# Patient Record
Sex: Male | Born: 1945 | Race: Black or African American | Hispanic: No | Marital: Married | Smoking: Former smoker
Health system: Southern US, Community
[De-identification: ages and names within clinical notes are randomized; demographics above are authoritative.]

## PROBLEM LIST (undated history)

## (undated) DIAGNOSIS — N529 Male erectile dysfunction, unspecified: Secondary | ICD-10-CM

## (undated) DIAGNOSIS — I739 Peripheral vascular disease, unspecified: Secondary | ICD-10-CM

## (undated) DIAGNOSIS — I10 Essential (primary) hypertension: Secondary | ICD-10-CM

---

## 2015-07-24 ENCOUNTER — Ambulatory Visit: Payer: Self-pay | Admitting: Family Medicine

## 2017-02-22 ENCOUNTER — Ambulatory Visit (INDEPENDENT_AMBULATORY_CARE_PROVIDER_SITE_OTHER): Payer: Medicare Other

## 2017-02-22 ENCOUNTER — Encounter: Payer: Self-pay | Admitting: Gynecology

## 2017-02-22 ENCOUNTER — Emergency Department: Payer: Medicare Other

## 2017-02-22 ENCOUNTER — Ambulatory Visit
Admission: EM | Admit: 2017-02-22 | Discharge: 2017-02-22 | Disposition: A | Discharge status: Home / Self Care | Payer: Medicare Other | Attending: Family Medicine | Admitting: Family Medicine

## 2017-02-22 ENCOUNTER — Emergency Department
Admission: EM | Admit: 2017-02-22 | Discharge: 2017-02-22 | Disposition: A | Discharge status: Home / Self Care | Payer: Medicare Other | Attending: Emergency Medicine | Admitting: Emergency Medicine

## 2017-02-22 DIAGNOSIS — K5900 Constipation, unspecified: Secondary | ICD-10-CM | POA: Diagnosis not present

## 2017-02-22 DIAGNOSIS — K2970 Gastritis, unspecified, without bleeding: Secondary | ICD-10-CM | POA: Insufficient documentation

## 2017-02-22 DIAGNOSIS — Z7982 Long term (current) use of aspirin: Secondary | ICD-10-CM | POA: Insufficient documentation

## 2017-02-22 DIAGNOSIS — Z79899 Other long term (current) drug therapy: Secondary | ICD-10-CM | POA: Diagnosis not present

## 2017-02-22 DIAGNOSIS — Z87891 Personal history of nicotine dependence: Secondary | ICD-10-CM | POA: Diagnosis not present

## 2017-02-22 DIAGNOSIS — I10 Essential (primary) hypertension: Secondary | ICD-10-CM | POA: Insufficient documentation

## 2017-02-22 DIAGNOSIS — R1111 Vomiting without nausea: Secondary | ICD-10-CM

## 2017-02-22 DIAGNOSIS — R1084 Generalized abdominal pain: Secondary | ICD-10-CM | POA: Diagnosis not present

## 2017-02-22 DIAGNOSIS — R1013 Epigastric pain: Secondary | ICD-10-CM | POA: Diagnosis present

## 2017-02-22 DIAGNOSIS — I739 Peripheral vascular disease, unspecified: Secondary | ICD-10-CM | POA: Diagnosis not present

## 2017-02-22 HISTORY — DX: Essential (primary) hypertension: I10

## 2017-02-22 HISTORY — DX: Peripheral vascular disease, unspecified: I73.9

## 2017-02-22 HISTORY — DX: Male erectile dysfunction, unspecified: N52.9

## 2017-02-22 LAB — URINALYSIS, COMPLETE (UACMP) WITH MICROSCOPIC
BILIRUBIN URINE: NEGATIVE
Bacteria, UA: NONE SEEN
Glucose, UA: NEGATIVE mg/dL
Ketones, ur: NEGATIVE mg/dL
Leukocytes, UA: NEGATIVE
Nitrite: NEGATIVE
Protein, ur: NEGATIVE mg/dL
SPECIFIC GRAVITY, URINE: 1.02 (ref 1.005–1.030)
pH: 5 (ref 5.0–8.0)

## 2017-02-22 LAB — COMPREHENSIVE METABOLIC PANEL
ALT: 21 U/L (ref 17–63)
AST: 34 U/L (ref 15–41)
Albumin: 4.5 g/dL (ref 3.5–5.0)
Alkaline Phosphatase: 49 U/L (ref 38–126)
Anion gap: 10 (ref 5–15)
BILIRUBIN TOTAL: 1 mg/dL (ref 0.3–1.2)
BUN: 15 mg/dL (ref 6–20)
CALCIUM: 9.1 mg/dL (ref 8.9–10.3)
CO2: 29 mmol/L (ref 22–32)
CREATININE: 1.47 mg/dL — AB (ref 0.61–1.24)
Chloride: 93 mmol/L — ABNORMAL LOW (ref 101–111)
GFR calc Af Amer: 54 mL/min — ABNORMAL LOW (ref >60)
GFR, EST NON AFRICAN AMERICAN: 46 mL/min — AB (ref >60)
Glucose, Bld: 119 mg/dL — ABNORMAL HIGH (ref 65–99)
Potassium: 3.1 mmol/L — ABNORMAL LOW (ref 3.5–5.1)
Sodium: 132 mmol/L — ABNORMAL LOW (ref 135–145)
TOTAL PROTEIN: 8 g/dL (ref 6.5–8.1)

## 2017-02-22 LAB — CBC WITH DIFFERENTIAL/PLATELET
BASOS ABS: 0.1 10*3/uL (ref 0–0.1)
Basophils Relative: 1 %
Eosinophils Absolute: 0.1 10*3/uL (ref 0–0.7)
Eosinophils Relative: 1 %
HEMATOCRIT: 44.8 % (ref 40.0–52.0)
Hemoglobin: 14.2 g/dL (ref 13.0–18.0)
LYMPHS ABS: 3.9 10*3/uL — AB (ref 1.0–3.6)
LYMPHS PCT: 38 %
MCH: 24.6 pg — AB (ref 26.0–34.0)
MCHC: 31.8 g/dL — ABNORMAL LOW (ref 32.0–36.0)
MCV: 77.6 fL — AB (ref 80.0–100.0)
MONO ABS: 0.9 10*3/uL (ref 0.2–1.0)
Monocytes Relative: 8 %
NEUTROS ABS: 5.5 10*3/uL (ref 1.4–6.5)
Neutrophils Relative %: 52 %
Platelets: 285 10*3/uL (ref 150–440)
RBC: 5.77 MIL/uL (ref 4.40–5.90)
RDW: 13.6 % (ref 11.5–14.5)
WBC: 10.4 10*3/uL (ref 3.8–10.6)

## 2017-02-22 LAB — LIPASE, BLOOD: LIPASE: 35 U/L (ref 11–51)

## 2017-02-22 MED ORDER — IOPAMIDOL (ISOVUE-300) INJECTION 61%
30.0000 mL | Freq: Once | INTRAVENOUS | Status: AC | PRN
  Administered 2017-02-22: 30 mL via ORAL

## 2017-02-22 MED ORDER — SODIUM CHLORIDE 0.9 % IV BOLUS (SEPSIS)
1000.0000 mL | Freq: Once | INTRAVENOUS | Status: AC
  Administered 2017-02-22: 1000 mL via INTRAVENOUS

## 2017-02-22 MED ORDER — IOPAMIDOL (ISOVUE-370) INJECTION 76%: 75.0000 mL | Freq: Once | INTRAVENOUS | Status: DC | PRN

## 2017-02-22 MED ORDER — ONDANSETRON HCL 4 MG PO TABS: 4.0000 mg | ORAL_TABLET | Freq: Three times a day (TID) | ORAL | 0 refills | Status: AC | PRN

## 2017-02-22 MED ORDER — PANTOPRAZOLE SODIUM 20 MG PO TBEC: 20.0000 mg | DELAYED_RELEASE_TABLET | Freq: Every day | ORAL | 1 refills | Status: AC

## 2017-02-22 MED ORDER — POTASSIUM CHLORIDE 20 MEQ PO PACK: 20.0000 meq | PACK | Freq: Every day | ORAL | 0 refills | Status: AC

## 2017-02-22 MED ORDER — DICYCLOMINE HCL 20 MG PO TABS: 20.0000 mg | ORAL_TABLET | Freq: Three times a day (TID) | ORAL | 0 refills | Status: AC | PRN

## 2017-02-22 MED ORDER — IOPAMIDOL (ISOVUE-300) INJECTION 61%
80.0000 mL | Freq: Once | INTRAVENOUS | Status: AC | PRN
  Administered 2017-02-22: 80 mL via INTRAVENOUS

## 2017-02-22 MED ORDER — POTASSIUM CHLORIDE CRYS ER 20 MEQ PO TBCR
20.0000 meq | EXTENDED_RELEASE_TABLET | Freq: Once | ORAL | Status: AC
  Administered 2017-02-22: 20 meq via ORAL
  Filled 2017-02-22: qty 1

## 2017-02-22 NOTE — ED Provider Notes (Signed)
Time Seen: Approximately 1544  I have reviewed the triage notes  Chief Complaint: Abdominal Pain   History of Present Illness: Derek Armstrong is a 71 y.o. male who presents from urgent care for evaluation of some epigastric abdominal pain that started on Wednesday night. He states occasional worsening with movement. He had some nausea with episode of vomiting once tonight. He denies any persistent vomiting though states he has had what sounds like reflux and has had a decreased appetite. Chills or productive cough. He denies any melena or hematochezia. He was referred here from an urgent care center for further evaluation of his abdominal pain.   Past Medical History:  Diagnosis Date  . ED (erectile dysfunction)   . Hypertension   . Peripheral vascular disease (HCC)     There are no active problems to display for this patient.   History reviewed. No pertinent surgical history.  History reviewed. No pertinent surgical history.  Current Outpatient Rx  . Order #: [id number] Class: Historical Med  . Order #: [id number] Class: Historical Med  . Order #: [id number] Class: Historical Med  . Order #: [id number] Class: Print  . Order #: [id number] Class: Print  . Order #: [id number] Class: Print  . Order #: [id number] Class: Print    Allergies:  Penicillins  Family History: No family history on file.  Social History: Social History  Substance Use Topics  . Smoking status: Former Games developermoker  . Smokeless tobacco: Never Used     Comment: quit x 3 trs  . Alcohol use Yes     Review of Systems:   10 point review of systems was performed and was otherwise negative:  Constitutional: No fever Eyes: No visual disturbances ENT: No sore throat, ear pain Cardiac: No chest pain Respiratory: No shortness of breath, wheezing, or stridor Abdomen: Epigastric to the bilateral upper quadrant abdominal pain in, no vomiting, No diarrhea Endocrine: No weight loss, No night sweats Extremities: No  peripheral edema, cyanosis Skin: No rashes, easy bruising Neurologic: No focal weakness, trouble with speech or swollowing Urologic: No dysuria, Hematuria, or urinary frequency Patient denies any leg pain, peripheral edema, claudication type symptoms.  Physical Exam:  ED Triage Vitals [02/22/17 1319]  Enc Vitals Group     BP (!) 173/81     Pulse Rate 97     Resp 18     Temp 98 F (36.7 C)     Temp Source Oral     SpO2 97 %     Weight 175 lb (79.4 kg)     Height 5\' 9"  (1.753 m)     Head Circumference      Peak Flow      Pain Score 8     Pain Loc      Pain Edu?      Excl. in GC?     General: Awake , Alert , and Oriented times 3; GCS 15 Head: Normal cephalic , atraumatic Eyes: Pupils equal , round, reactive to light Nose/Throat: No nasal drainage, patent upper airway without erythema or exudate.  Neck: Supple, Full range of motion, No anterior adenopathy or palpable thyroid masses Lungs: Clear to ascultation without wheezes , rhonchi, or rales Heart: Regular rate, regular rhythm without murmurs , gallops , or rubs Abdomen: Mild tenderness over the epigastric area without any rebound, guarding, or rigidity. No reproducible lower abdominal pain or palpable masses are noted      Extremities: 2 plus symmetric pulses. No edema, clubbing or cyanosis Neurologic: normal ambulation, Motor symmetric without deficits,  sensory intact Skin: warm, dry, no rashes   Labs:   All laboratory work was reviewed including any pertinent negatives or positives listed below:  Labs Reviewed  URINALYSIS, COMPLETE (UACMP) WITH MICROSCOPIC - Abnormal; Notable for the following:       Result Value   Color, Urine YELLOW (*)    APPearance CLEAR (*)    Hgb urine dipstick SMALL (*)    Squamous Epithelial / LPF 0-5 (*)    All other components within normal limits    Radiology:  "Ct Abdomen Pelvis W Contrast  Result Date: 02/22/2017 CLINICAL DATA:  71 year old male with abdominal pain and vomiting  for 3 days. EXAM: CT ABDOMEN AND PELVIS WITH CONTRAST TECHNIQUE: Multidetector CT imaging of the abdomen and pelvis was performed using the standard protocol following bolus administration of intravenous contrast. CONTRAST:  80mL ISOVUE-300 IOPAMIDOL (ISOVUE-300) INJECTION 61% COMPARISON:  None. FINDINGS: Lower chest: No acute abnormality. Hepatobiliary: The liver and gallbladder are unremarkable except for tiny hepatic cysts. There is no evidence of biliary dilatation. Pancreas: Unremarkable Spleen: Unremarkable Adrenals/Urinary Tract: Small bilateral renal cysts are present. There is no evidence of renal mass, hydronephrosis or urinary calculi. The adrenal glands and bladder are unremarkable. Stomach/Bowel: Stomach is within normal limits. Appendix appears normal. No evidence of bowel wall thickening, distention, or inflammatory changes. Vascular/Lymphatic: Abdominal aortic atherosclerotic calcifications noted without aneurysm. Atherosclerotic calcification and plaque within the aortic branch vessels and iliac and femoral arteries are also noted bilaterally. There is apparent occlusion of the left external iliac artery with reconstitution of the left common femoral artery. Apparent occlusion of both proximal superficial femoral arteries also noted. Reproductive: Prostate enlargement identified. Other: No ascites, pneumoperitoneum or abscess. Musculoskeletal: No acute or suspicious bony abnormalities. L5-S1 facet arthropathy noted. IMPRESSION: No evidence of acute abnormality. Abdominal aortic atherosclerosis without aneurysm. 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 Apparent occlusion of both proximal superficial femoral arteries also noted. Prostate enlargement. Electronically Signed   By: Harmon Pier M.D.   On: 02/22/2017 17:30   Dg Abd 2 Views  Result Date: 02/22/2017 CLINICAL DATA:   Epigastric pain EXAM: ABDOMEN - 2 VIEW COMPARISON:  None. FINDINGS: Scattered large and small bowel gas is noted. Some fecal material is noted within the colon particularly in the right colon. No free air is seen. No mass lesion is noted. No bony abnormality is seen. IMPRESSION: No acute abnormality noted. Electronically Signed   By: Alcide Clever M.D.   On: 02/22/2017 11:15  "  I personally reviewed the radiologic studies   ED Course:  Patient has the appearance of some peripheral vascular disease, however, I felt this was not the cause of his epigastric pain. I felt this was unlikely to be mesenteric ischemia and most of his vascular pathology seems to be peripheral and found on the CT. Patient has known peripheral vascular disease and he was given a copy of the CAT scan results to take with him to follow up with his primary physician or vascular surgeon as needed. As his abdominal pain is seems to be more of gastritis with decreased appetite and no signs of melena or hematochezia etc. Patient does not appear to have any pancreatitis. Patient was found to have some mild hypokalemia which is likely secondary to his blood pressure medication. He appears to have good control of his blood pressure and I decided to place the patient on some  outpatient supplemental potassium until follow up .     Assessment:  Gastritis Asymptomatic Peripheral vascular disease  Final Clinical Impression:  Final diagnoses:  Gastritis without bleeding, unspecified chronicity, unspecified gastritis type     Plan: * Outpatient " New Prescriptions   DICYCLOMINE (BENTYL) 20 MG TABLET    Take 1 tablet (20 mg total) by mouth 3 (three) times daily as needed for spasms.   ONDANSETRON (ZOFRAN) 4 MG TABLET    Take 1 tablet (4 mg total) by mouth every 8 (eight) hours as needed for nausea or vomiting.   PANTOPRAZOLE (PROTONIX) 20 MG TABLET    Take 1 tablet (20 mg total) by mouth daily.   POTASSIUM CHLORIDE (KLOR-CON) 20 MEQ  PACKET    Take 20 mEq by mouth daily.  " Patient was advised to return immediately if condition worsens. Patient was advised to follow up with their primary care physician or other specialized physicians involved in their outpatient care. The patient and/or family member/power of attorney had laboratory results reviewed at the bedside. All questions and concerns were addressed and appropriate discharge instructions were distributed by the nursing staff. Jennye Moccasin, MD 02/22/17 [phone]

## 2017-02-22 NOTE — ED Triage Notes (Signed)
Per patient stomach issue x 3 days. Patient stated that he is having constipation  Problem and his last bowel movement was 3 days ago. Per patient x 3 day ago vomit x twice after having lab work done. Per patient has not receive his lab result as yet.

## 2017-02-22 NOTE — Discharge Instructions (Signed)
Please drink plenty of fluids and follow up with your primary physician. Return to emergency department for fever, bloody stool, persistent vomiting, leg pain, or any other new concerns  Please return immediately if condition worsens. Please contact her primary physician or the physician you were given for referral. If you have any specialist physicians involved in her treatment and plan please also contact them. Thank you for using [facility].

## 2017-02-22 NOTE — ED Triage Notes (Signed)
Pt sent to ED from urgent care w/ c/o abd pain. Psts pain began Wednesday night, been ongoing since. Pt sts pain worse w/ movement.  Pt reports vomiting Wed night, none since.  Pt alert and oriented. Ambulatory to triage w/o issue. Denies CP, SOB or fevers. Pt sts he had blood work drawn at urgent care w/i last 4 hours.

## 2017-02-22 NOTE — ED Provider Notes (Signed)
[facility]    CSN: [id number] Arrival date & time: 02/22/17  [id number]     History   Chief Complaint Chief Complaint  Patient presents with  . Stomach issue  . Constipation    HPI Derek Armstrong is a 71 y.o. male.   71 yo male with a c/o 3 days of slowly progressively worsening mid abdominal pain. States pain occasionally worse with movement and better if sitting still. Pain is 8/10. Patient also states he's had decreased appetite and has not eaten much food in the last 2-3 days. His last bowel movement was on Wednesday (3 days ago) and also had a couple of episodes of vomiting that day but no vomiting since then.   Of note patient is a former smoker, has hypertension, and moderate to severe peripheral vascular disease ([facility]    The history is provided by the patient.  Constipation    Past Medical History:  Diagnosis Date  . ED (erectile dysfunction)   . Hypertension   . Peripheral vascular disease (HCC)     There are no active problems to display for this patient.   History reviewed. No pertinent surgical history.     Home Medications    Prior to Admission medications   Medication Sig Start Date End Date Taking? Authorizing Provider  aspirin 81 MG chewable tablet Chew by mouth daily.   Yes Historical Provider, MD  cilostazol (PLETAL) 100 MG tablet Take 100 mg by mouth 2 (two) times daily.   Yes Historical Provider, MD  losartan-hydrochlorothiazide (HYZAAR) 100-25 MG tablet Take 1 tablet by mouth daily.   Yes Historical Provider, MD    Family History No family history on file.  Social History Social History  Substance Use Topics  . Smoking status: Former Games developermoker  . Smokeless tobacco: Never Used     Comment: quit x 3 trs  . Alcohol use Yes     Allergies   Penicillins   Review of Systems Review of Systems  Gastrointestinal: Positive for constipation.     Physical Exam Triage Vital Signs ED Triage Vitals  Enc  Vitals Group     BP 02/22/17 1025 (!) 159/95     Pulse Rate 02/22/17 1025 88     Resp 02/22/17 1025 16     Temp 02/22/17 1025 98.6 F (37 C)     Temp Source 02/22/17 1025 Oral     SpO2 02/22/17 1025 99 %     Weight 02/22/17 1028 175 lb (79.4 kg)     Height 02/22/17 1028 5\' 9"  (1.753 m)     Head Circumference --      Peak Flow --      Pain Score 02/22/17 1032 8     Pain Loc --      Pain Edu? --      Excl. in GC? --    No data found.   Updated Vital Signs BP (!) 159/95 (BP Location: Left Arm)   Pulse 88   Temp 98.6 F (37 C) (Oral)   Resp 16   Ht 5\' 9"  (1.753 m)   Wt 175 lb (79.4 kg)   SpO2 99%   BMI 25.84 kg/m   Visual Acuity Right Eye Distance:   Left Eye Distance:   Bilateral Distance:    Right Eye Near:   Left Eye Near:    Bilateral Near:     Physical Exam  Constitutional: He is oriented to person, place, and time. He  appears well-developed and well-nourished. No distress.  HENT:  Head: Normocephalic and atraumatic.  Cardiovascular: Normal rate, regular rhythm and normal heart sounds.  Exam reveals decreased pulses.   No murmur heard. Pulses:      Femoral pulses are 1+ on the right side, and 3+ on the left side.      Dorsalis pedis pulses are 0 on the right side, and 0 on the left side.       Posterior tibial pulses are 0 on the right side, and 0 on the left side.  Pulmonary/Chest: Effort normal and breath sounds normal. No respiratory distress. He has no wheezes. He has no rales.  Abdominal: Soft. Bowel sounds are normal. He exhibits no distension and no mass. There is tenderness (mild, minimal to palpation). There is no rebound and no guarding.  Neurological: He is alert and oriented to person, place, and time.  Skin: No rash noted. He is not diaphoretic.  Nursing note and vitals reviewed.    UC Treatments / Results  Labs (all labs ordered are listed, but only abnormal results are displayed) Labs Reviewed  CBC WITH DIFFERENTIAL/PLATELET - Abnormal;  Notable for the following:       Result Value   MCV 77.6 (*)    MCH 24.6 (*)    MCHC 31.8 (*)    Lymphs Abs 3.9 (*)    All other components within normal limits  COMPREHENSIVE METABOLIC PANEL - Abnormal; Notable for the following:    Sodium 132 (*)    Potassium 3.1 (*)    Chloride 93 (*)    Glucose, Bld 119 (*)    Creatinine, Ser 1.47 (*)    GFR calc non Af Amer 46 (*)    GFR calc Af Amer 54 (*)    All other components within normal limits  LIPASE, BLOOD    EKG  EKG Interpretation None       Radiology Dg Abd 2 Views  Result Date: 02/22/2017 CLINICAL DATA:  Epigastric pain EXAM: ABDOMEN - 2 VIEW COMPARISON:  None. FINDINGS: Scattered large and small bowel gas is noted. Some fecal material is noted within the colon particularly in the right colon. No free air is seen. No mass lesion is noted. No bony abnormality is seen. IMPRESSION: No acute abnormality noted. Electronically Signed   By: Alcide Clever M.D.   On: 02/22/2017 11:15    Procedures Procedures (including critical care time)  Medications Ordered in UC Medications - No data to display   Initial Impression / Assessment and Plan / UC Course  I have reviewed the triage vital signs and the nursing notes.  Pertinent labs & imaging results that were available during my care of the patient were reviewed by me and considered in my medical decision making (see chart for details).      Final Clinical Impressions(s) / UC Diagnoses   Final diagnoses:  Generalized abdominal pain  Peripheral vascular disease (HCC)  Constipation, unspecified constipation type  Non-intractable vomiting without nausea, unspecified vomiting type    New Prescriptions Discharge Medication List as of 02/22/2017 11:56 AM     1. Lab results, x-ray results and possible etiologies of his abdominal pain reviewed with patient; due to his h/o moderate to severe peripheral vascular disease (last evaluated in 2014) discussed with patient concern for  possible vascular etiology and recommendation to go to ED for further evaluation and management. Patient verbalizes understanding and will proceed to ED by private vehicle. Report called to triage RN at  [facility] ED.    Payton Mccallum, MD 02/22/17 1257

## 2017-02-22 NOTE — Discharge Instructions (Signed)
Recommend patient go to Emergency Department for further evaluation and management °

## 2017-09-23 IMAGING — CT CT ABD-PELV W/ CM
2 of 5 series · 16 of 46 positions shown, 18 images · IV contrast (iopamidol)
Comparison: None.

CLINICAL DATA: 70-year-old male with abdominal pain and vomiting
for 3 days.

EXAM:
CT ABDOMEN AND PELVIS WITH CONTRAST
TECHNIQUE: Multidetector CT imaging of the abdomen and pelvis was performed
using the standard protocol following bolus administration of
intravenous contrast.
CONTRAST:  80mL 7ROVTU-QBB IOPAMIDOL (7ROVTU-QBB) INJECTION 61%

[Series 2: routine abd/pel with · axial · 0.81mm/px · z∈[-446,-56]mm · 13 of 88 slices shown, 15 images]
[im 5/88  soft-tissue]
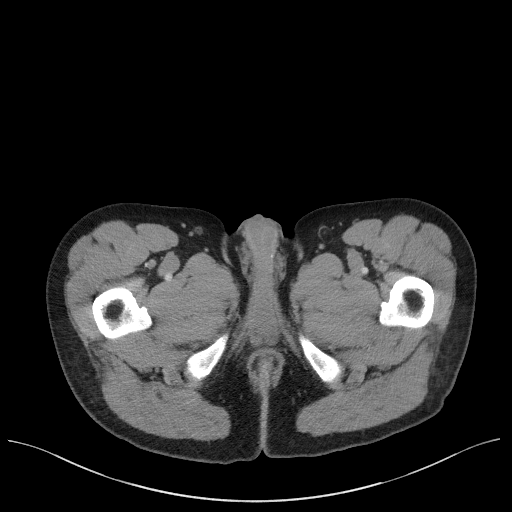
[im 5/88  bone]
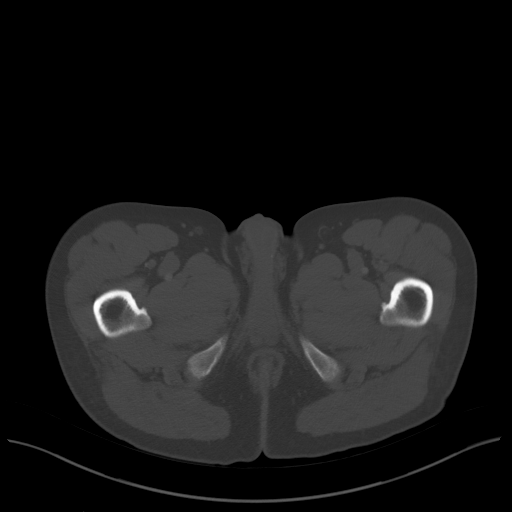
[im 14/88  soft-tissue]
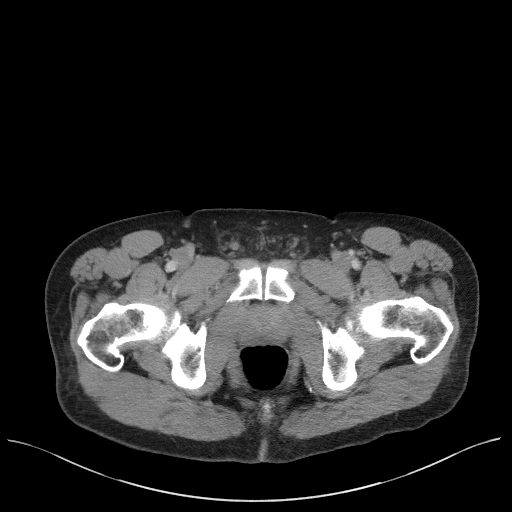
[im 19/88  soft-tissue]
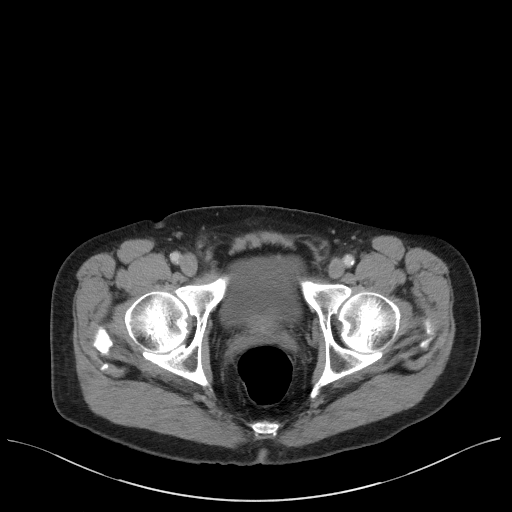
[im 23/88  soft-tissue]
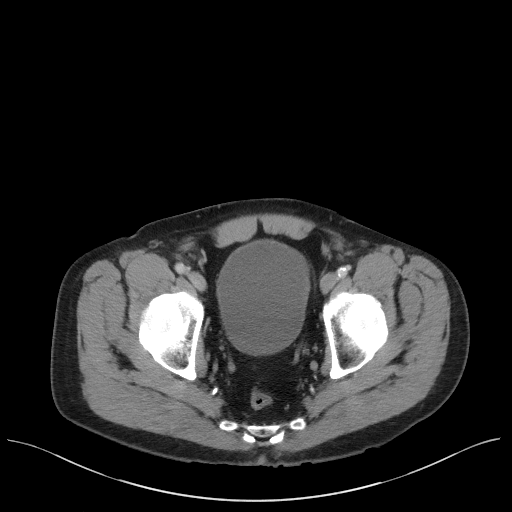
[im 33/88  soft-tissue]
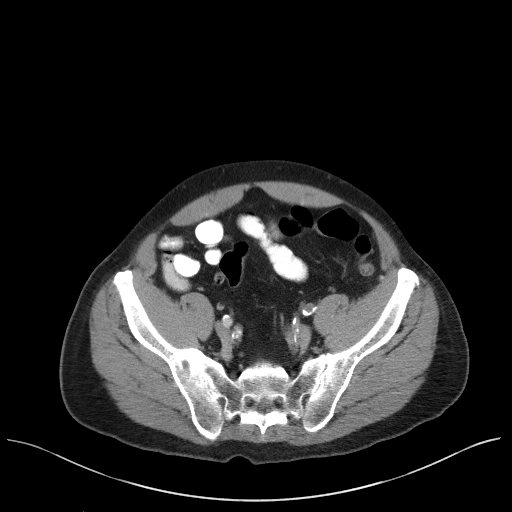
[im 37/88  soft-tissue]
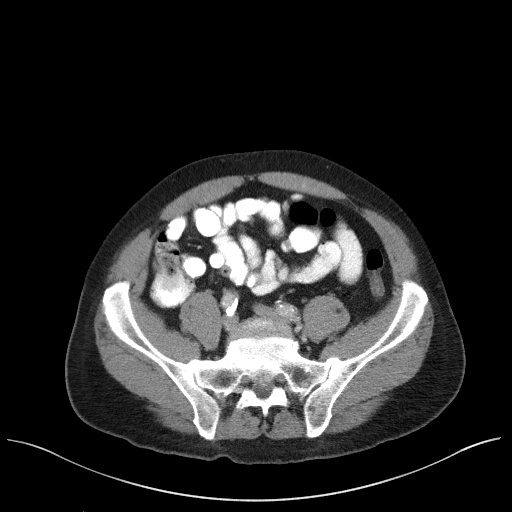
[im 46/88  soft-tissue]
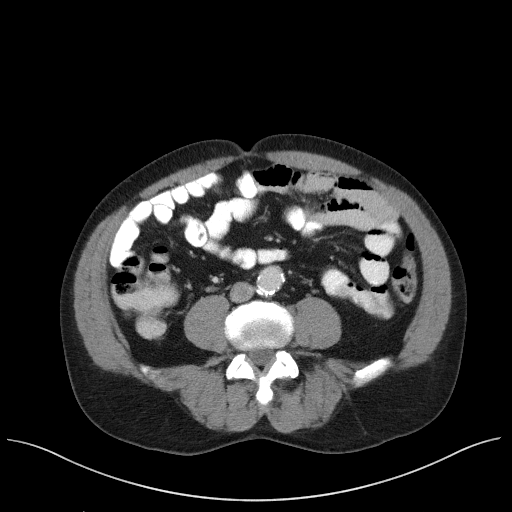
[im 51/88  soft-tissue]
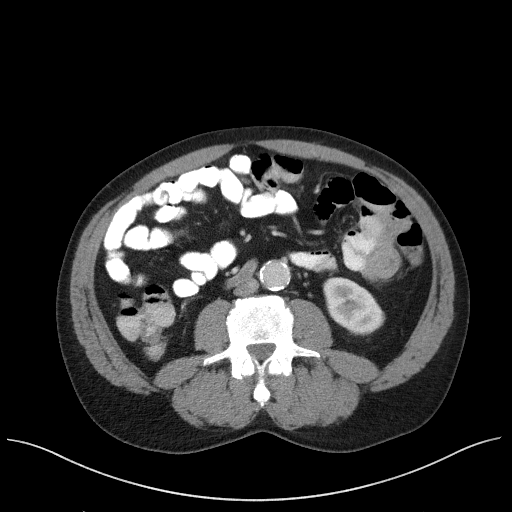
[im 55/88  soft-tissue]
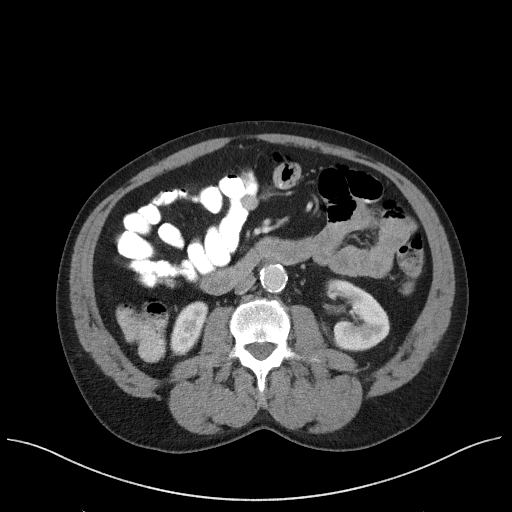
[im 55/88  bone]
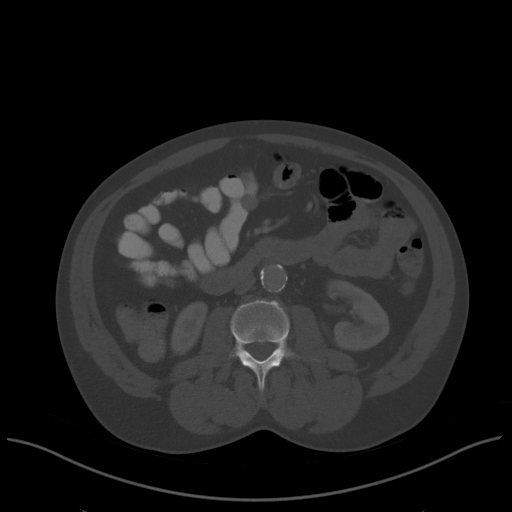
[im 65/88  soft-tissue]
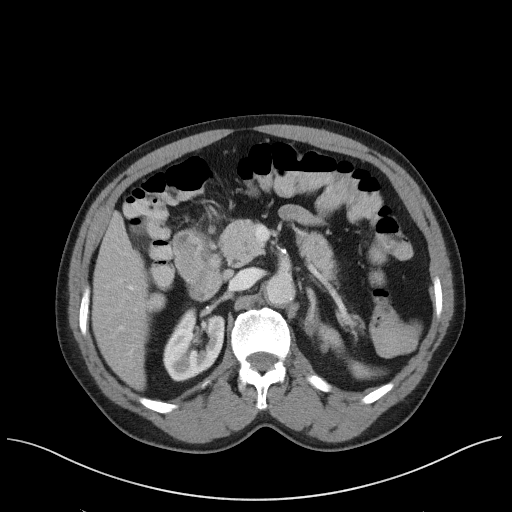
[im 69/88  soft-tissue]
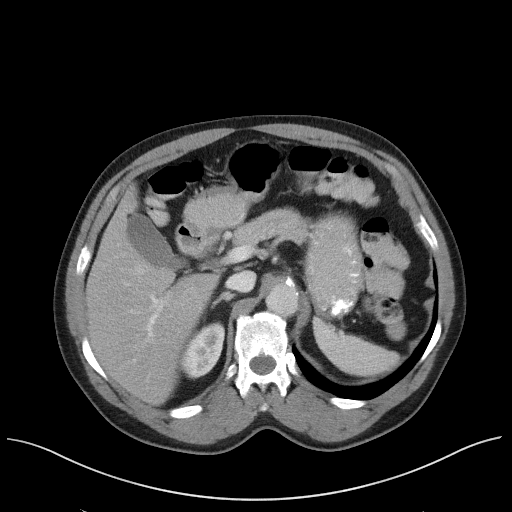
[im 74/88  soft-tissue]
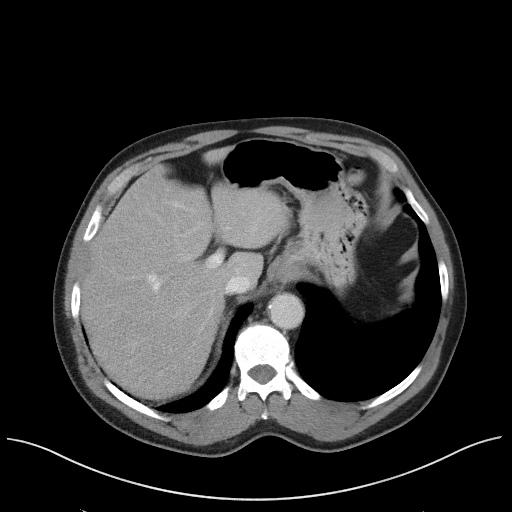
[im 83/88  soft-tissue]
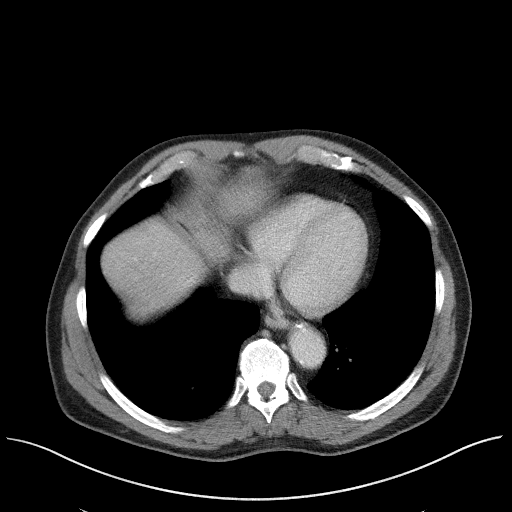

[Series 5: coronal st · coronal · 0.68mm/px · 3 of 95 slices shown]
[im 32/95  soft-tissue]
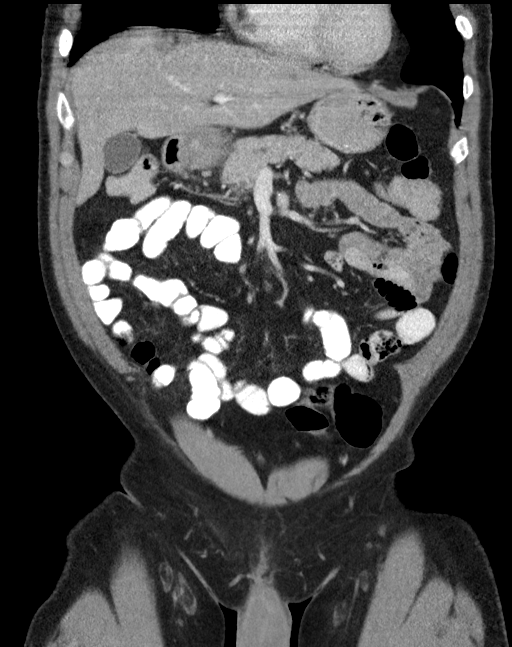
[im 42/95  soft-tissue]
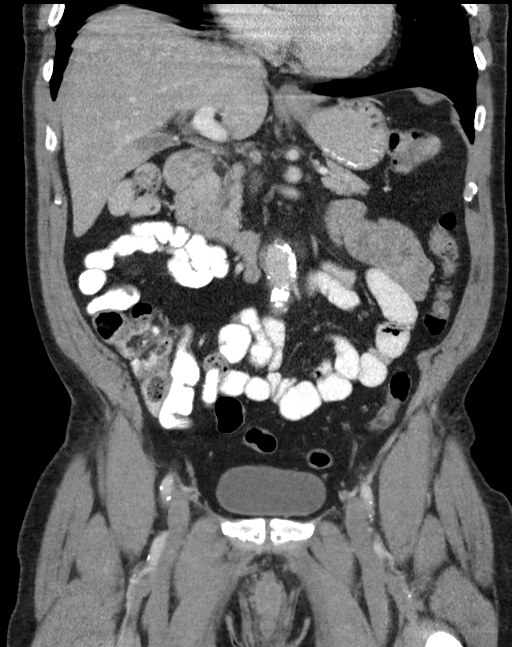
[im 53/95  soft-tissue]
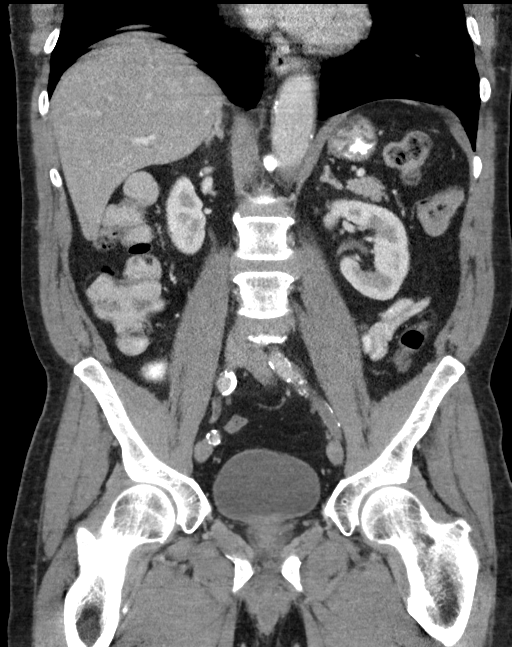

[16 of 46 positions shown; findings below may reference images not displayed]

FINDINGS: Lower chest: No acute abnormality.

Hepatobiliary: The liver and gallbladder are unremarkable except for
tiny hepatic cysts. There is no evidence of biliary dilatation.

Pancreas: Unremarkable

Spleen: Unremarkable

Adrenals/Urinary Tract: Small bilateral renal cysts are present.
There is no evidence of renal mass, hydronephrosis or urinary
calculi. The adrenal glands and bladder are unremarkable.

Stomach/Bowel: Stomach is within normal limits. Appendix appears
normal. No evidence of bowel wall thickening, distention, or
inflammatory changes.

Vascular/Lymphatic: Abdominal aortic atherosclerotic calcifications
noted without aneurysm. Atherosclerotic calcification and plaque
within the aortic branch vessels and iliac and femoral arteries are
also noted bilaterally. There is apparent occlusion of the left
external iliac artery with reconstitution of the left common femoral
artery. Apparent occlusion of both proximal superficial femoral
arteries also noted.

Reproductive: Prostate enlargement identified.

Other: No ascites, pneumoperitoneum or abscess.

Musculoskeletal: No acute or suspicious bony abnormalities. L5-S1
facet arthropathy noted.
IMPRESSION: No evidence of acute abnormality.

Abdominal aortic atherosclerosis without aneurysm.

Bilateral iliac and femoral artery atherosclerosis with apparent
occlusion of the left external iliac artery with reconstitution of
the left common femoral artery. Apparent occlusion of both proximal
superficial femoral arteries also noted.

Prostate enlargement.

## 2017-09-23 IMAGING — CR DG ABDOMEN 2V
2 series · 2 of 2 positions shown · non-contrast
Comparison: None.

CLINICAL DATA: Epigastric pain

EXAM:
ABDOMEN - 2 VIEW

[abdomen erect]
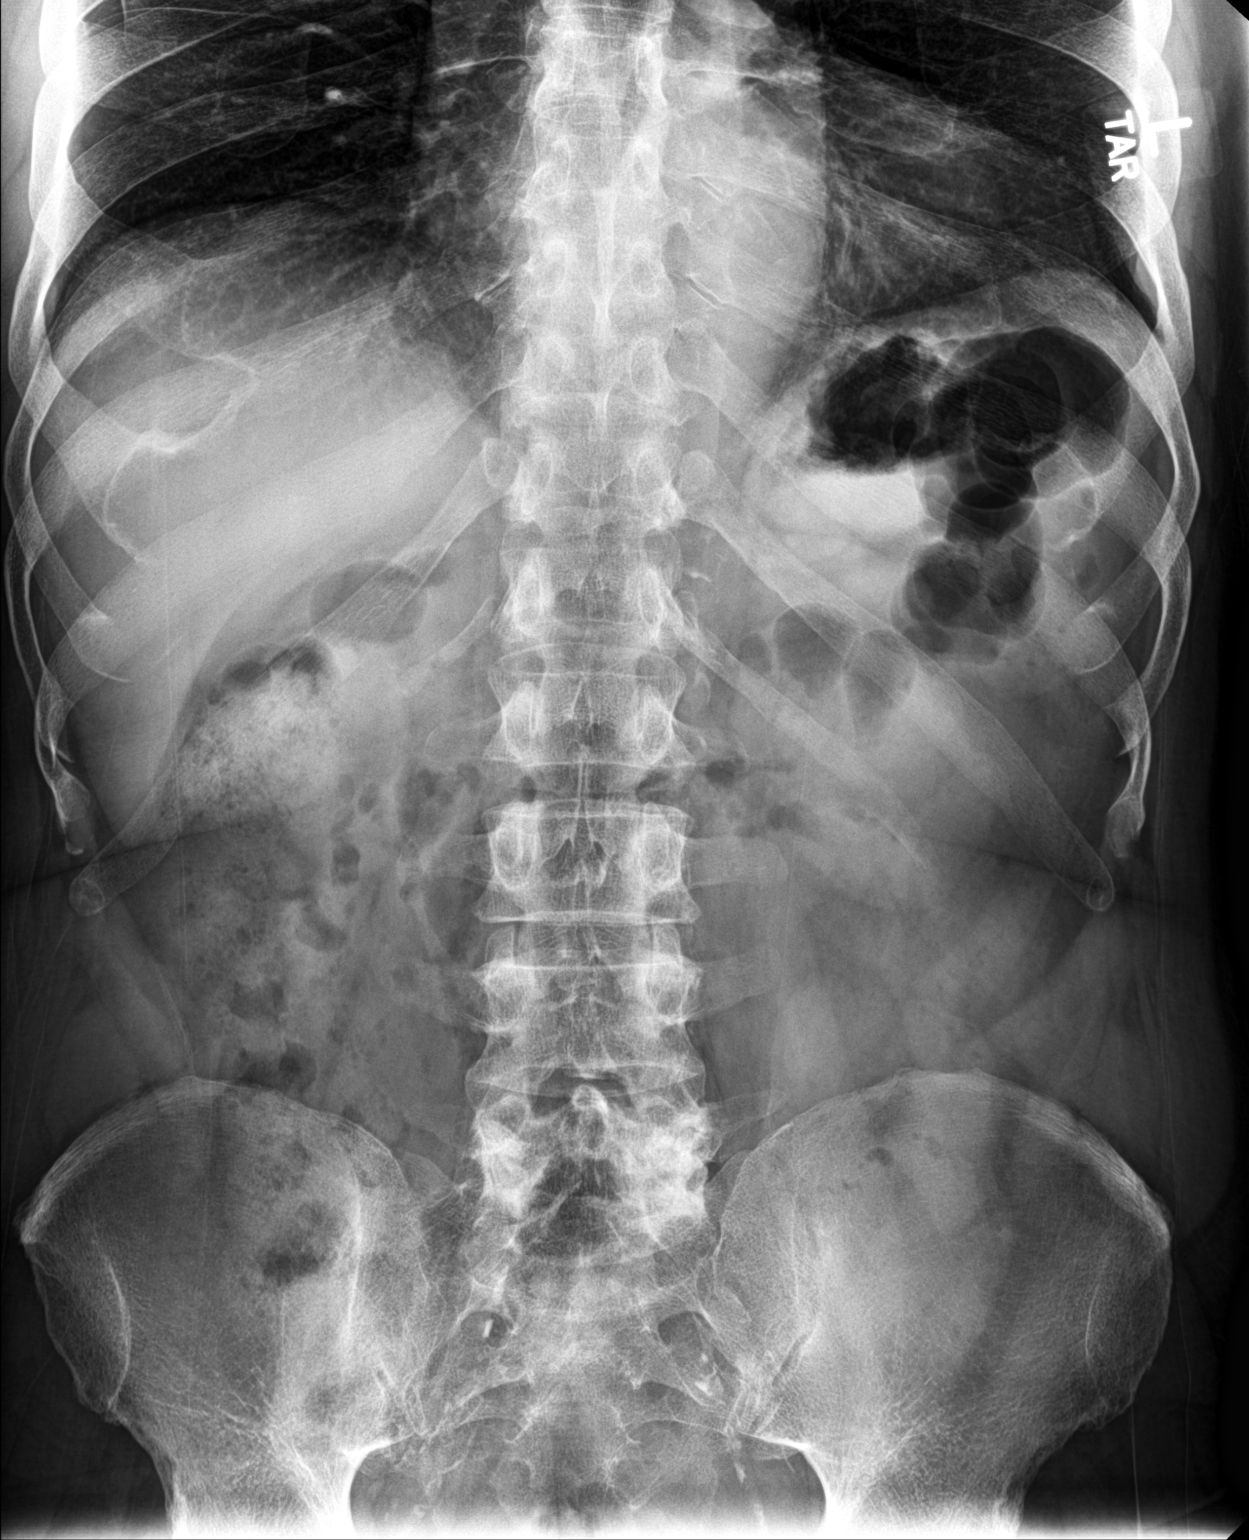

[abdomen supine]
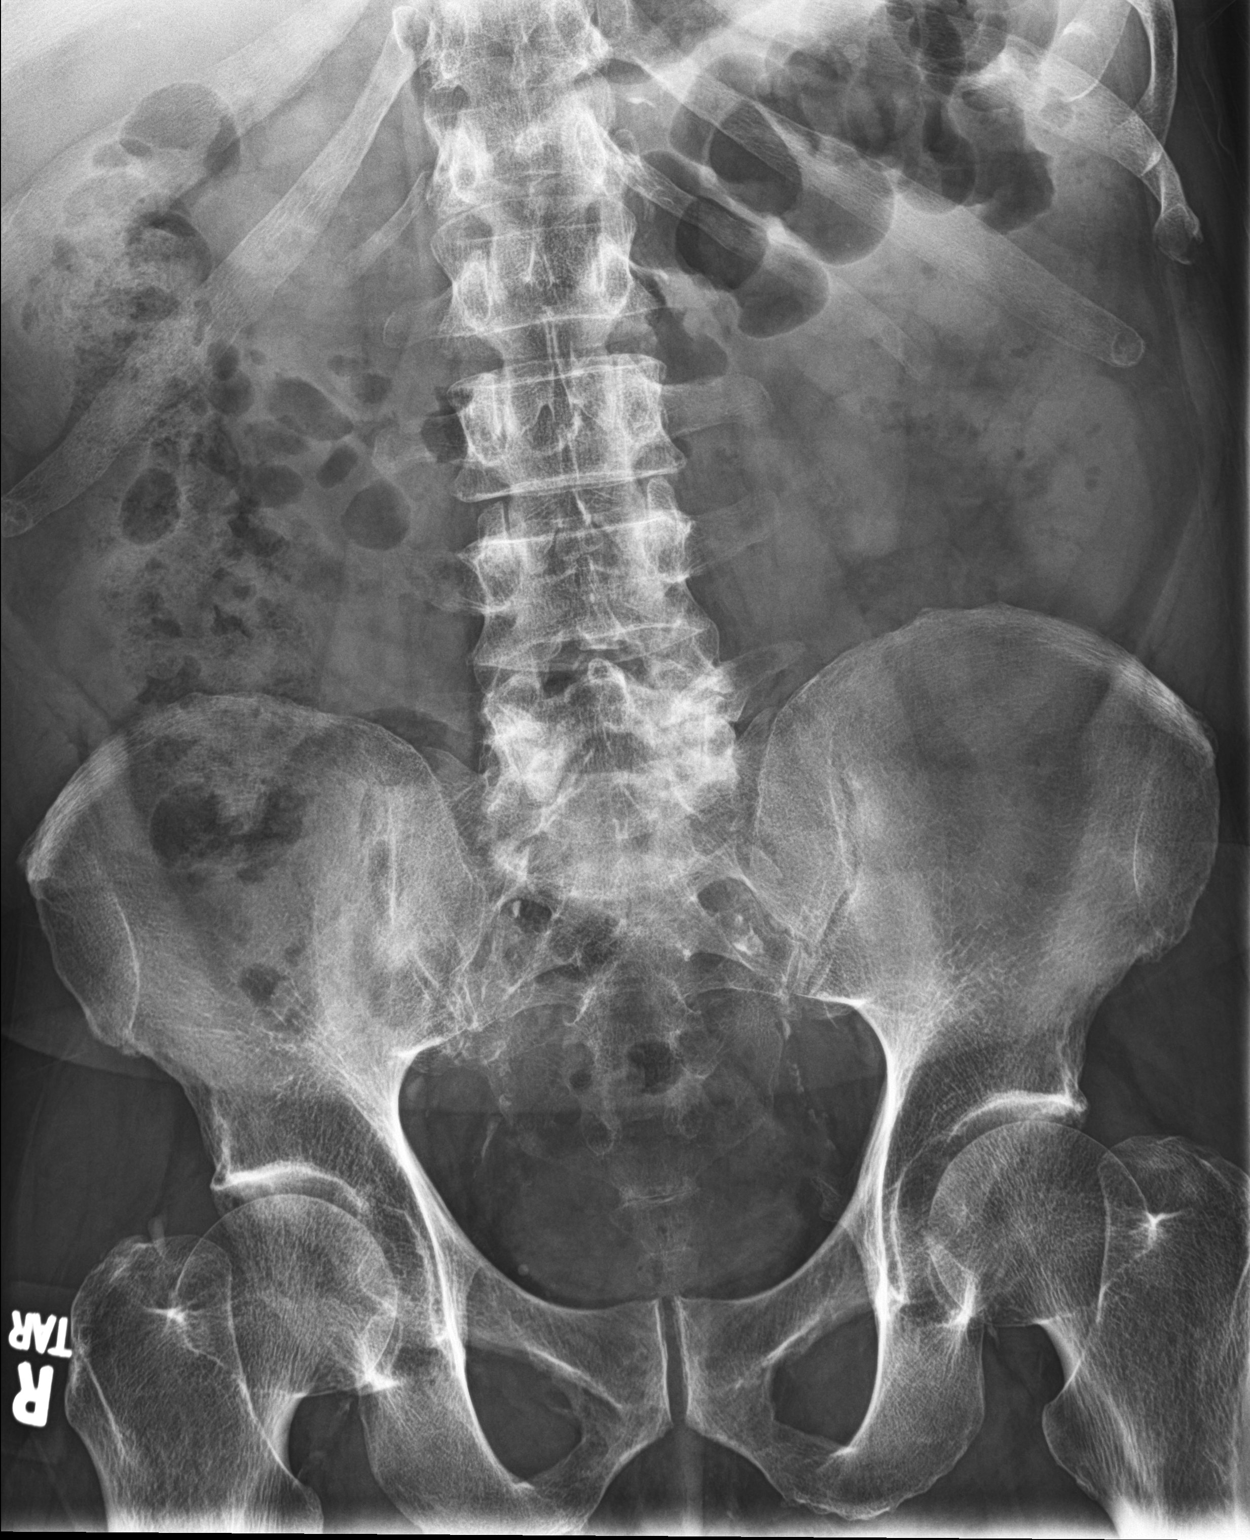

[2 of 2 positions shown; findings below may reference images not displayed]

FINDINGS: Scattered large and small bowel gas is noted. Some fecal material is
noted within the colon particularly in the right colon. No free air
is seen. No mass lesion is noted. No bony abnormality is seen.
IMPRESSION: No acute abnormality noted.

## 2023-11-16 DEATH — deceased
